# Patient Record
Sex: Male | Born: 1960 | Race: Black or African American | Hispanic: No | Marital: Single | Smoking: Current every day smoker
Health system: Southern US, Community
[De-identification: ages and names within clinical notes are randomized; demographics above are authoritative.]

## PROBLEM LIST (undated history)

## (undated) HISTORY — PX: OTHER SURGICAL HISTORY: SHX169

## (undated) HISTORY — PX: KNEE SURGERY: SHX244

---

## 2017-07-24 ENCOUNTER — Encounter: Payer: Self-pay | Admitting: Emergency Medicine

## 2017-07-24 ENCOUNTER — Emergency Department
Admission: EM | Admit: 2017-07-24 | Discharge: 2017-07-24 | Disposition: A | Discharge status: Home / Self Care | Payer: Worker's Compensation | Attending: Emergency Medicine | Admitting: Emergency Medicine

## 2017-07-24 ENCOUNTER — Emergency Department: Payer: Worker's Compensation

## 2017-07-24 DIAGNOSIS — Y939 Activity, unspecified: Secondary | ICD-10-CM | POA: Insufficient documentation

## 2017-07-24 DIAGNOSIS — Y929 Unspecified place or not applicable: Secondary | ICD-10-CM | POA: Diagnosis not present

## 2017-07-24 DIAGNOSIS — F17200 Nicotine dependence, unspecified, uncomplicated: Secondary | ICD-10-CM | POA: Insufficient documentation

## 2017-07-24 DIAGNOSIS — Z23 Encounter for immunization: Secondary | ICD-10-CM | POA: Insufficient documentation

## 2017-07-24 DIAGNOSIS — Y990 Civilian activity done for income or pay: Secondary | ICD-10-CM | POA: Insufficient documentation

## 2017-07-24 DIAGNOSIS — S52502A Unspecified fracture of the lower end of left radius, initial encounter for closed fracture: Secondary | ICD-10-CM | POA: Diagnosis not present

## 2017-07-24 DIAGNOSIS — S50312A Abrasion of left elbow, initial encounter: Secondary | ICD-10-CM | POA: Diagnosis not present

## 2017-07-24 DIAGNOSIS — T148XXA Other injury of unspecified body region, initial encounter: Secondary | ICD-10-CM

## 2017-07-24 DIAGNOSIS — S6992XA Unspecified injury of left wrist, hand and finger(s), initial encounter: Secondary | ICD-10-CM | POA: Diagnosis present

## 2017-07-24 MED ORDER — OXYCODONE-ACETAMINOPHEN 5-325 MG PO TABS: 1.0000 | ORAL_TABLET | Freq: Four times a day (QID) | ORAL | 0 refills | Status: AC | PRN

## 2017-07-24 MED ORDER — BACITRACIN-NEOMYCIN-POLYMYXIN 400-5-5000 EX OINT
TOPICAL_OINTMENT | CUTANEOUS | Status: AC
  Filled 2017-07-24: qty 1

## 2017-07-24 MED ORDER — TETANUS-DIPHTH-ACELL PERTUSSIS 5-2.5-18.5 LF-MCG/0.5 IM SUSP
0.5000 mL | Freq: Once | INTRAMUSCULAR | Status: AC
  Administered 2017-07-24: 0.5 mL via INTRAMUSCULAR
  Filled 2017-07-24: qty 0.5

## 2017-07-24 NOTE — ED Provider Notes (Signed)
[facility] Emergency Department Provider Note  ____________________________________________  Time seen: Approximately 8:53 AM  I have reviewed the triage vital signs and the nursing notes.   HISTORY  Chief Complaint Wrist Pain    HPI Eric Compton is a 56 y.o. male that presents to emergency department for evaluation of left wrist pain after fall. Patient fell out of a truck and landed on his left wrist. He has not been able to move wrist since fall. He also has an abrasion to his left elbow but is not concerned that this is broken. He did not hit his head or lose consciousness. He does not recall last tetanus shot. No headache, visual changes, shortness breath, chest pain, nausea, vomiting, abdominal pain.   History reviewed. No pertinent past medical history.  There are no active problems to display for this patient.   Past Surgical History:  Procedure Laterality Date  . KNEE SURGERY Left   . tendon surgery left arm      Prior to Admission medications   Medication Sig Start Date End Date Taking? Authorizing Provider  oxyCODONE-acetaminophen (ROXICET) 5-325 MG tablet Take 1 tablet by mouth every 6 (six) hours as needed. 07/24/17 07/27/17  Enid Derry, PA-C    Allergies Patient has no known allergies.  No family history on file.  Social History Social History  Substance Use Topics  . Smoking status: Current Every Day Smoker  . Smokeless tobacco: Never Used  . Alcohol use Yes     Review of Systems  Constitutional: No fever/chills Cardiovascular: No chest pain. Respiratory: No SOB. Gastrointestinal: No abdominal pain.  No nausea, no vomiting.  Musculoskeletal: Positive for wrist pain. Skin: Negative for rash, ecchymosis. Neurological: Negative for headaches, numbness or tingling   ____________________________________________   PHYSICAL EXAM:  VITAL SIGNS: ED Triage Vitals [07/24/17 0837]  Enc Vitals Group     BP 129/74   Pulse Rate 71     Resp 14     Temp (!) 97.5 F (36.4 C)     Temp Source Oral     SpO2 96 %     Weight 200 lb (90.7 kg)     Height  (1.727 m)     Head Circumference      Peak Flow      Pain Score 8     Pain Loc      Pain Edu?      Excl. in GC?      Constitutional: Alert and oriented. Well appearing and in no acute distress. Eyes: Conjunctivae are normal. PERRL. EOMI. Head: Atraumatic. ENT:      Ears:      Nose: No congestion/rhinnorhea.      Mouth/Throat: Mucous membranes are moist.  Neck: No stridor. Cardiovascular: Normal rate, regular rhythm.  Good peripheral circulation. 2+ radial pulses. Respiratory: Normal respiratory effort without tachypnea or retractions. Lungs CTAB. Good air entry to the bases with no decreased or absent breath sounds. Musculoskeletal:  No gross deformities appreciated. Mild swelling to left wrist. Tenderness to palpation over distal radius. Pain with all range of motion of left wrist. Good strength in all fingers. Neurologic:  Normal speech and language. No gross focal neurologic deficits are appreciated.  Skin:  Skin is warm, dry. 2, 1 cm oval shaped abrasions to left elbow   ____________________________________________   LABS (all labs ordered are listed, but only abnormal results are displayed)  Labs Reviewed - No data to display ____________________________________________  EKG   ____________________________________________  RADIOLOGY  I, Enid Derry, personally viewed and evaluated these images (plain radiographs) as part of my medical decision making, as well as reviewing the written report by the radiologist.  Dg Wrist Complete Left  Result Date: 07/24/2017 CLINICAL DATA:  Status post fall. Swelling and tenderness of the left wrist especially laterally. EXAM: LEFT WRIST - COMPLETE 3+ VIEW COMPARISON:  None in PACs FINDINGS: There is a tiny cortical irregularity involving the lateral aspect of the distal radial metaphysis at the  wrist which may reflect an impacted fracture. Elsewhere the distal radius as well as the ulna are intact. The carpal bones are intact as are the metacarpals where visualized. The joint spaces are well maintained. There is mild soft tissue swelling dorsally. IMPRESSION: Possible minimally impacted fracture of the distal radial metaphysis along the lateral surface. Alternatively this may be developmental. Correlation with any symptoms in this region is needed. Elsewhere no acute bony abnormality is observed. Electronically Signed   By: David  [place] M.D.   On: 07/24/2017 09:27    ____________________________________________    PROCEDURES  Procedure(s) performed:    Procedures    Medications  neomycin-bacitracin-polymyxin (NEOSPORIN) 400-02-4999 ointment (not administered)  Tdap (BOOSTRIX) injection 0.5 mL (0.5 mLs Intramuscular Given 07/24/17 0943)     ____________________________________________   INITIAL IMPRESSION / ASSESSMENT AND PLAN / ED COURSE  Pertinent labs & imaging results that were available during my care of the patient were reviewed by me and considered in my medical decision making (see chart for details).  Review of the [place] CSRS was performed in accordance of the [facility] prior to dispensing any controlled drugs.   Patient presented to the emergency department for evaluation of wrist pain after fall. Vital signs and exam are reassuring. X-ray consistent with possible impacted distal radius fracture. Patient is tender at this point. Thumb spica splint and shoulder sling was given. Tetanus shot was updated. Patient will be discharged home with prescriptions for Roxicet. Patient is to follow up with orthopedics as directed. Patient is given ED precautions to return to the ED for any worsening or new symptoms.     ____________________________________________  FINAL CLINICAL IMPRESSION(S) / ED DIAGNOSES  Final diagnoses:  Closed fracture of distal end of left radius,  unspecified fracture morphology, initial encounter  Abrasion      NEW MEDICATIONS STARTED DURING THIS VISIT:  New Prescriptions   OXYCODONE-ACETAMINOPHEN (ROXICET) 5-325 MG TABLET    Take 1 tablet by mouth every 6 (six) hours as needed.        This chart was dictated using voice recognition software/Dragon. Despite best efforts to proofread, errors can occur which can change the meaning. Any change was purely unintentional.    Enid Derry, PA-C 07/24/17 1040    Schaevitz, Myra Rude, MD 07/24/17 [phone]

## 2017-07-24 NOTE — ED Triage Notes (Signed)
Says fell of step of truck otday at work injured left elbow and left wrist.  Wrist with swelling

## 2017-07-24 NOTE — ED Notes (Signed)
See triage note  States he fell from step of truck  Landed on left arm  abrasion noted to elbow  Swelling and tenderness noted to left wrist  Good sensation and pulses  Spoke with Cleda Mccreedy at [employer] and is requesting a non-dot UDS

## 2018-08-05 IMAGING — DX DG WRIST COMPLETE 3+V*L*
4 series · 4 of 4 positions shown · non-contrast
Comparison: None in PACs

CLINICAL DATA: Status post fall. Swelling and tenderness of the
left wrist especially laterally.

EXAM:
LEFT WRIST - COMPLETE 3+ VIEW

[wrist ap (1 of 2)]
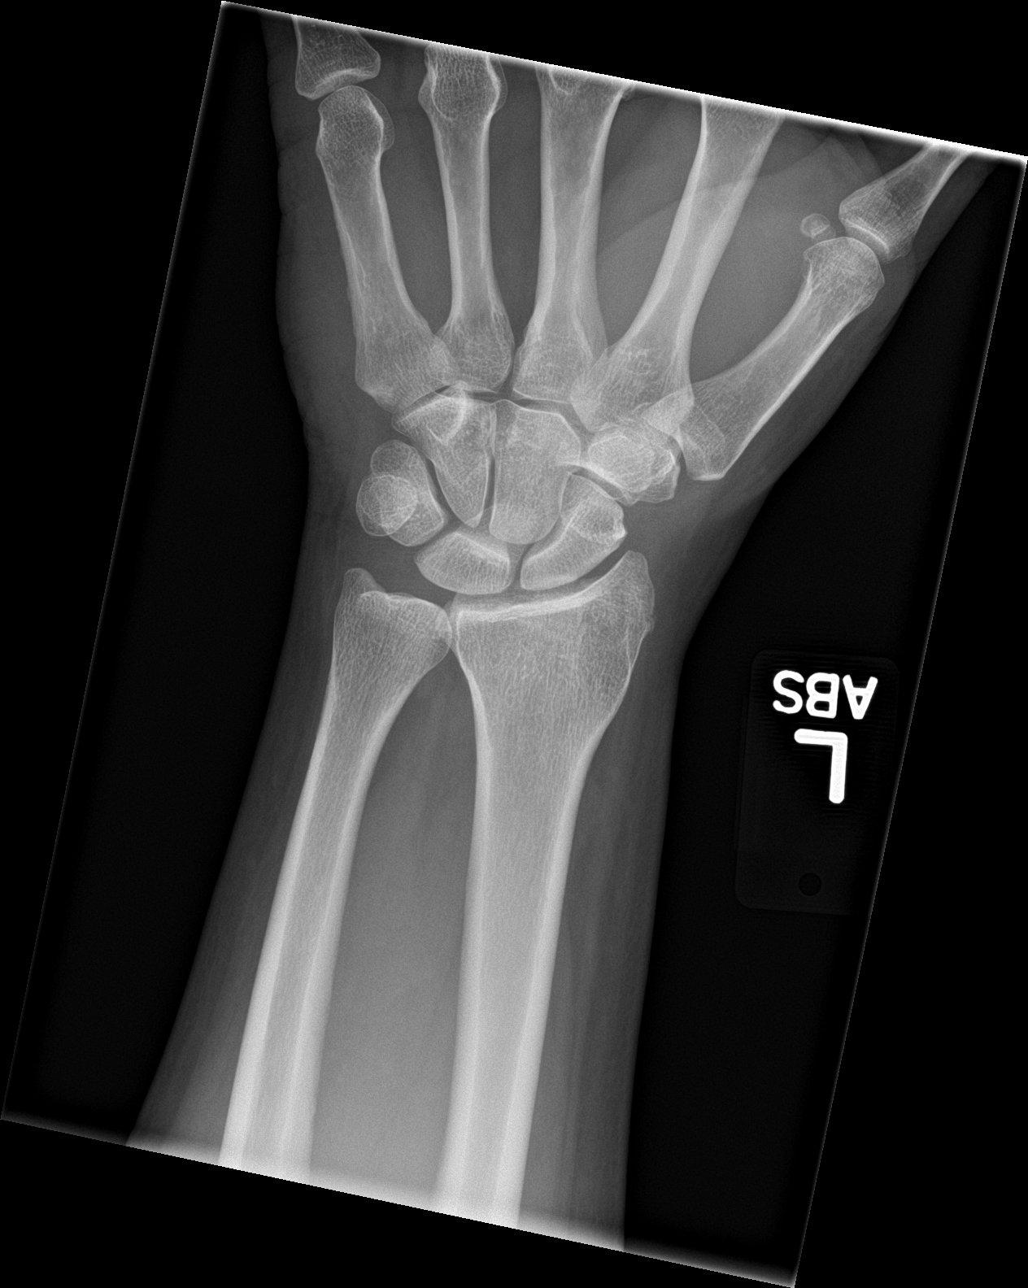

[wrist obl]
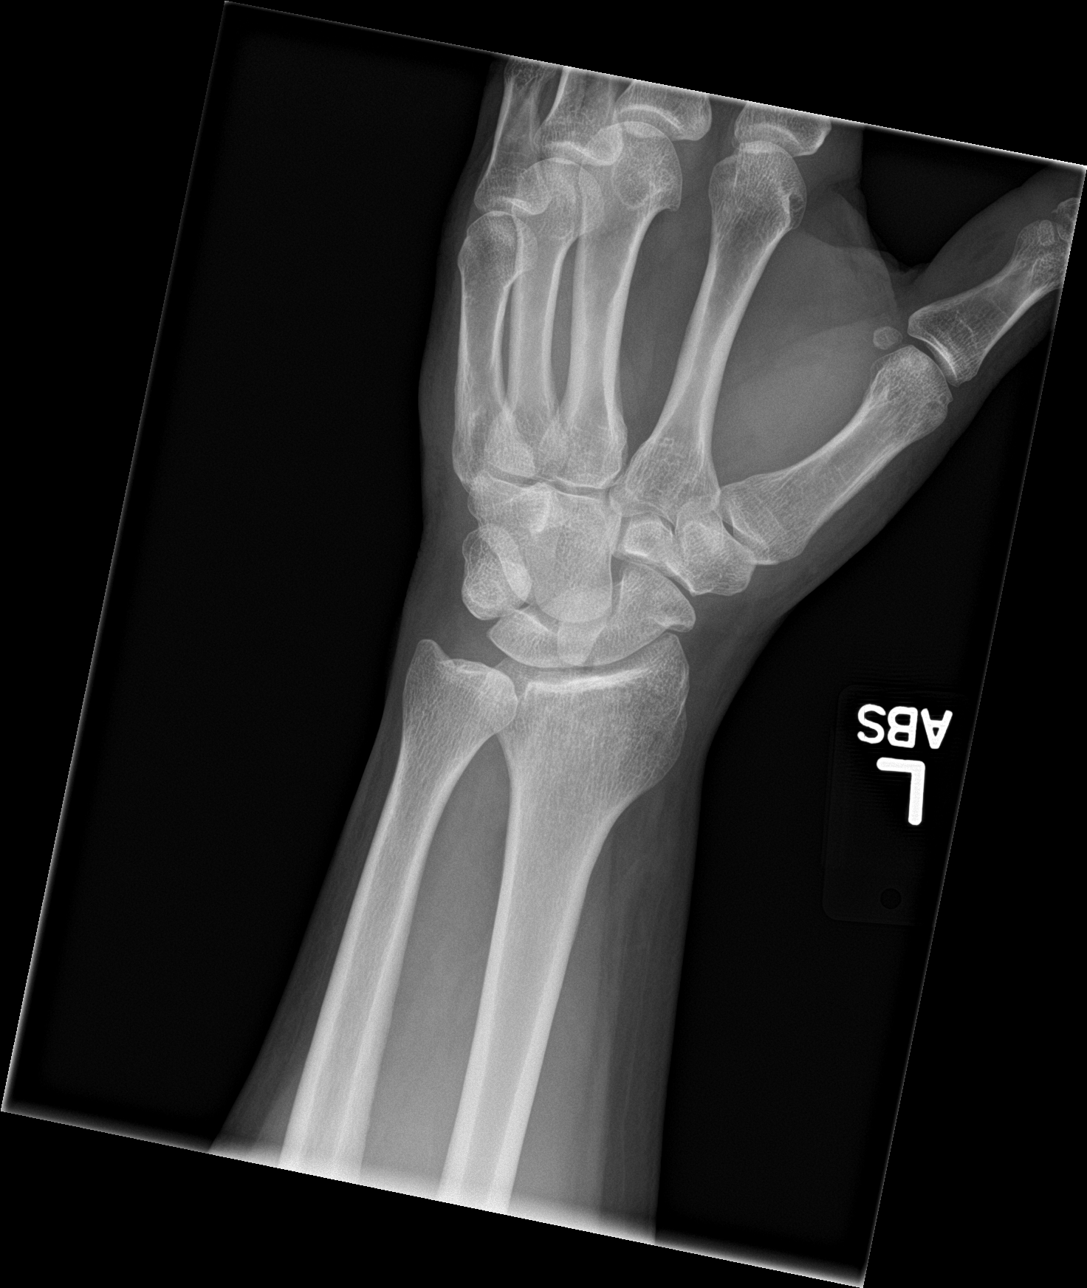

[wrist lat]
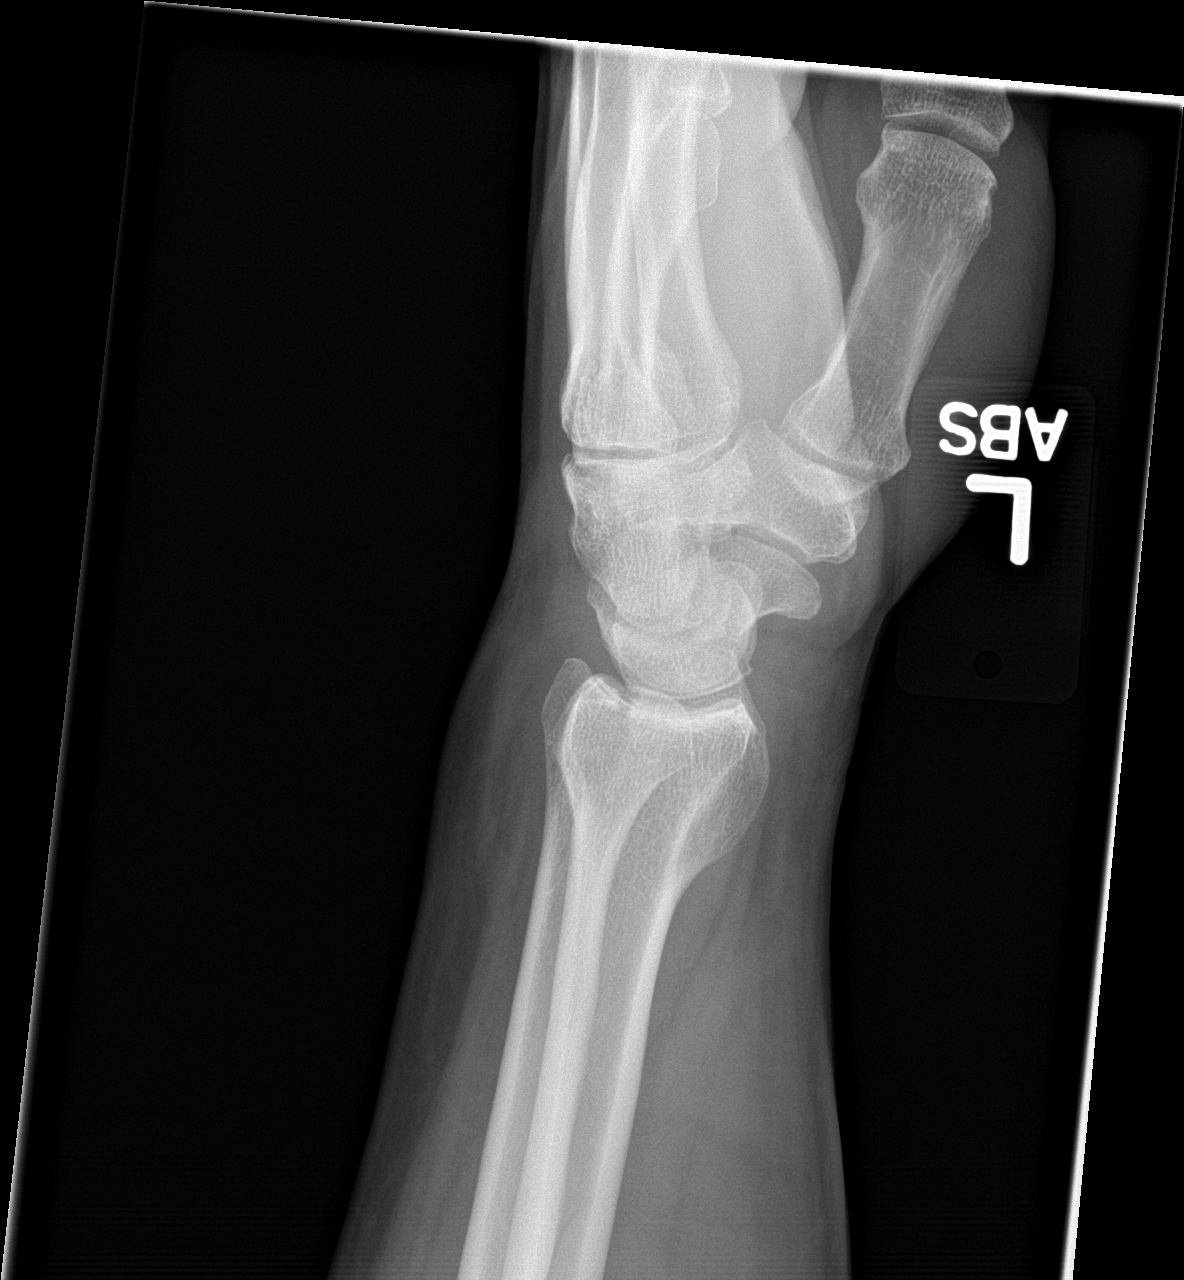

[wrist ap (2 of 2)]
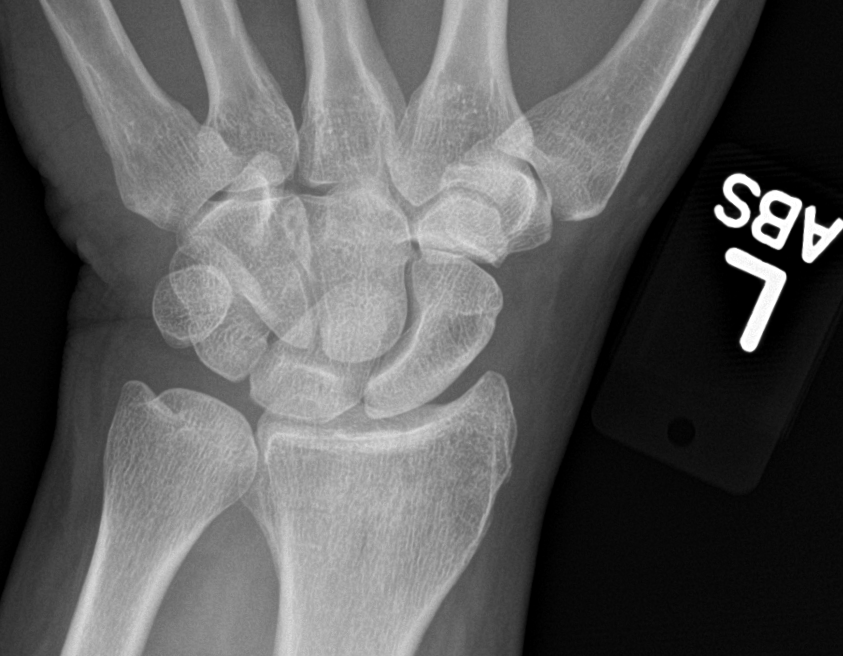

[4 of 4 positions shown; findings below may reference images not displayed]

FINDINGS: There is a tiny cortical irregularity involving the lateral aspect
of the distal radial metaphysis at the wrist which may reflect an
impacted fracture. Elsewhere the distal radius as well as the ulna
are intact. The carpal bones are intact as are the metacarpals where
visualized. The joint spaces are well maintained. There is mild soft
tissue swelling dorsally.
IMPRESSION: Possible minimally impacted fracture of the distal radial metaphysis
along the lateral surface. Alternatively this may be developmental.
Correlation with any symptoms in this region is needed. Elsewhere no
acute bony abnormality is observed.
